# Patient Record
Sex: Female | Born: 1985 | Race: White | Hispanic: No | Marital: Single | Smoking: Current some day smoker
Health system: Southern US, Community
[De-identification: ages and names within clinical notes are randomized; demographics above are authoritative.]

## PROBLEM LIST (undated history)

## (undated) DIAGNOSIS — E213 Hyperparathyroidism, unspecified: Secondary | ICD-10-CM

## (undated) DIAGNOSIS — N289 Disorder of kidney and ureter, unspecified: Secondary | ICD-10-CM

## (undated) HISTORY — PX: URETERAL STENT PLACEMENT: SHX822

## (undated) HISTORY — PX: PARATHYROIDECTOMY: SHX19

## (undated) HISTORY — PX: TUBAL LIGATION: SHX77

---

## 2015-11-23 ENCOUNTER — Emergency Department (HOSPITAL_COMMUNITY): Payer: Self-pay

## 2015-11-23 ENCOUNTER — Emergency Department (HOSPITAL_COMMUNITY)
Admission: EM | Admit: 2015-11-23 | Discharge: 2015-11-23 | Disposition: A | Discharge status: Home / Self Care | Payer: Self-pay | Attending: Emergency Medicine | Admitting: Emergency Medicine

## 2015-11-23 ENCOUNTER — Encounter (HOSPITAL_COMMUNITY): Payer: Self-pay | Admitting: Emergency Medicine

## 2015-11-23 DIAGNOSIS — R5383 Other fatigue: Secondary | ICD-10-CM | POA: Insufficient documentation

## 2015-11-23 DIAGNOSIS — Z880 Allergy status to penicillin: Secondary | ICD-10-CM | POA: Insufficient documentation

## 2015-11-23 DIAGNOSIS — R109 Unspecified abdominal pain: Secondary | ICD-10-CM | POA: Insufficient documentation

## 2015-11-23 DIAGNOSIS — F17200 Nicotine dependence, unspecified, uncomplicated: Secondary | ICD-10-CM | POA: Insufficient documentation

## 2015-11-23 DIAGNOSIS — Z8639 Personal history of other endocrine, nutritional and metabolic disease: Secondary | ICD-10-CM | POA: Insufficient documentation

## 2015-11-23 DIAGNOSIS — Z3202 Encounter for pregnancy test, result negative: Secondary | ICD-10-CM | POA: Insufficient documentation

## 2015-11-23 DIAGNOSIS — Z87442 Personal history of urinary calculi: Secondary | ICD-10-CM | POA: Insufficient documentation

## 2015-11-23 DIAGNOSIS — Z87448 Personal history of other diseases of urinary system: Secondary | ICD-10-CM | POA: Insufficient documentation

## 2015-11-23 DIAGNOSIS — M549 Dorsalgia, unspecified: Secondary | ICD-10-CM | POA: Insufficient documentation

## 2015-11-23 HISTORY — DX: Hyperparathyroidism, unspecified: E21.3

## 2015-11-23 HISTORY — DX: Disorder of kidney and ureter, unspecified: N28.9

## 2015-11-23 LAB — CBC
HCT: 39.7 % (ref 36.0–46.0)
HEMOGLOBIN: 13.3 g/dL (ref 12.0–15.0)
MCH: 31.7 pg (ref 26.0–34.0)
MCHC: 33.5 g/dL (ref 30.0–36.0)
MCV: 94.5 fL (ref 78.0–100.0)
PLATELETS: 312 10*3/uL (ref 150–400)
RBC: 4.2 MIL/uL (ref 3.87–5.11)
RDW: 12.5 % (ref 11.5–15.5)
WBC: 7.7 10*3/uL (ref 4.0–10.5)

## 2015-11-23 LAB — BASIC METABOLIC PANEL
ANION GAP: 7 (ref 5–15)
BUN: 14 mg/dL (ref 6–20)
CALCIUM: 9.7 mg/dL (ref 8.9–10.3)
CHLORIDE: 108 mmol/L (ref 101–111)
CO2: 27 mmol/L (ref 22–32)
CREATININE: 0.99 mg/dL (ref 0.44–1.00)
GFR calc non Af Amer: >60 mL/min (ref >60)
Glucose, Bld: 103 mg/dL — ABNORMAL HIGH (ref 65–99)
Potassium: 3.9 mmol/L (ref 3.5–5.1)
SODIUM: 142 mmol/L (ref 135–145)

## 2015-11-23 LAB — URINALYSIS, ROUTINE W REFLEX MICROSCOPIC
Bilirubin Urine: NEGATIVE
Glucose, UA: NEGATIVE mg/dL
Hgb urine dipstick: NEGATIVE
KETONES UR: NEGATIVE mg/dL
NITRITE: NEGATIVE
PROTEIN: NEGATIVE mg/dL
Specific Gravity, Urine: 1.017 (ref 1.005–1.030)
pH: 7 (ref 5.0–8.0)

## 2015-11-23 LAB — URINE MICROSCOPIC-ADD ON
Bacteria, UA: NONE SEEN
RBC / HPF: NONE SEEN RBC/hpf (ref 0–5)

## 2015-11-23 LAB — POC URINE PREG, ED: PREG TEST UR: NEGATIVE

## 2015-11-23 MED ORDER — KETOROLAC TROMETHAMINE 10 MG PO TABS: 10.0000 mg | ORAL_TABLET | Freq: Four times a day (QID) | ORAL | Status: AC | PRN

## 2015-11-23 MED ORDER — MORPHINE SULFATE (PF) 4 MG/ML IV SOLN
4.0000 mg | Freq: Once | INTRAVENOUS | Status: AC
  Administered 2015-11-23: 4 mg via INTRAVENOUS
  Filled 2015-11-23: qty 1

## 2015-11-23 MED ORDER — ONDANSETRON HCL 4 MG/2ML IJ SOLN
4.0000 mg | Freq: Once | INTRAMUSCULAR | Status: AC
  Administered 2015-11-23: 4 mg via INTRAVENOUS
  Filled 2015-11-23: qty 2

## 2015-11-23 MED ORDER — SODIUM CHLORIDE 0.9 % IV BOLUS (SEPSIS)
1000.0000 mL | Freq: Once | INTRAVENOUS | Status: AC
  Administered 2015-11-23: 1000 mL via INTRAVENOUS

## 2015-11-23 MED ORDER — KETOROLAC TROMETHAMINE 30 MG/ML IJ SOLN
30.0000 mg | Freq: Once | INTRAMUSCULAR | Status: AC
  Administered 2015-11-23: 30 mg via INTRAVENOUS
  Filled 2015-11-23: qty 1

## 2015-11-23 NOTE — ED Notes (Signed)
PA-C at bedside 

## 2015-11-23 NOTE — Discharge Instructions (Signed)
Take your medications as prescribed as needed for pain relief.  Follow up with your Urologoist, Dr. Cleatrice Burkeoughlin, in the next 3-4 days. Please return to the Emergency Department if symptoms worsen or new onset of fever, chills, abdominal pain, vomiting, diarrhea, blood in urine or stool.

## 2015-11-23 NOTE — ED Notes (Signed)
Per pt, states lower back pain for three days-states she thinks she has a kidney infection-no dysuria

## 2015-11-23 NOTE — ED Notes (Signed)
US at bedside

## 2015-11-23 NOTE — ED Notes (Signed)
Nurse drawing labs. 

## 2015-11-23 NOTE — ED Provider Notes (Signed)
CSN: [id number]     Arrival date & time 11/23/15  1451 History   First MD Initiated Contact with Patient 11/23/15 1508     Chief Complaint  Patient presents with  . Back Pain     (Consider location/radiation/quality/duration/timing/severity/associated sxs/prior Treatment) Patient is a 29 y.o. female presenting with back pain.  Back Pain   Pt is a 28 yo female with PMH of hyperparathyroidism and renal disorder (multiple episodes of kidney stones, hx of 12 stents and 4 lithotripsies) who presents to the ED with complaint of bilateral flank pain, onset 3 days. Pt reports having worsening intermittent pain to bilateral flank regions that she describes as "pressure" but notes she is starting to have worsening aching pain to her right flank. Denies any aggravating or alleviating factors. Endorses associated fatigue. Denies fever, chills, cough, SOB, CP, abdominal pain, N/V/D, urinary sxs, hematuria, vaginal bleeding, vaginal d/c. Pt reports her pain feels similar to kidney stones/infections she has had in the past. She notes she last had a 6mm left kidney stone 3 months ago that she passed. LMP 2 weeks ago.   Past Medical History  Diagnosis Date  . Hyperparathyroidism (HCC)   . Renal disorder    Past Surgical History  Procedure Laterality Date  . Parathyroidectomy    . Tubal ligation    . Ureteral stent placement    . Cesarean section     No family history on file. Social History  Substance Use Topics  . Smoking status: Current Some Day Smoker  . Smokeless tobacco: None  . Alcohol Use: Yes   OB History    No data available     Review of Systems  Constitutional: Positive for fatigue.  Genitourinary: Positive for flank pain.  Musculoskeletal: Positive for back pain.  All other systems reviewed and are negative.     Allergies  Penicillins  Home Medications   Prior to Admission medications   Medication Sig Start Date End Date Taking? Authorizing Provider  ketorolac  (TORADOL) 10 MG tablet Take 1 tablet (10 mg total) by mouth every 6 (six) hours as needed. 11/23/15   Satira Sark Lindbergh Winkles, PA-C   BP 98/67 mmHg  Pulse 63  Temp(Src) 97.7 F (36.5 C) (Oral)  Resp 16  SpO2 100%  LMP 11/09/2015 Physical Exam  Constitutional: She is oriented to person, place, and time. She appears well-developed and well-nourished.  HENT:  Head: Normocephalic and atraumatic.  Mouth/Throat: Oropharynx is clear and moist. No oropharyngeal exudate.  Eyes: Conjunctivae and EOM are normal. Right eye exhibits no discharge. Left eye exhibits no discharge. No scleral icterus.  Neck: Normal range of motion. Neck supple.  Cardiovascular: Normal rate, regular rhythm, normal heart sounds and intact distal pulses.   Pulmonary/Chest: Effort normal and breath sounds normal. No respiratory distress. She has no wheezes. She has no rales. She exhibits no tenderness.  Abdominal: Soft. Bowel sounds are normal. She exhibits no distension and no mass. There is no tenderness. There is CVA tenderness (left). There is no rigidity, no rebound and no guarding.  Musculoskeletal: She exhibits no edema.  Neurological: She is alert and oriented to person, place, and time.  Skin: Skin is warm and dry.  Nursing note and vitals reviewed.   ED Course  Procedures (including critical care time) Labs Review Labs Reviewed  BASIC METABOLIC PANEL - Abnormal; Notable for the following:    Glucose, Bld 103 (*)    All other components within normal limits  URINALYSIS, ROUTINE W REFLEX MICROSCOPIC (  NOT AT [facility]) - Abnormal; Notable for the following:    Leukocytes, UA TRACE (*)    All other components within normal limits  URINE MICROSCOPIC-ADD ON - Abnormal; Notable for the following:    Squamous Epithelial / LPF 0-5 (*)    All other components within normal limits  CBC  POC URINE PREG, ED    Imaging Review [facility]  11/23/2015  CLINICAL DATA:  Bilateral flank pain, history of nephrolithiasis and  pyelonephritis EXAM: RENAL / URINARY TRACT ULTRASOUND COMPLETE COMPARISON:  None. FINDINGS: Right Kidney: Length: 10.8 cm. Echogenicity within normal limits. No mass or hydronephrosis visualized. Left Kidney: Length: 12.9 cm. Echogenicity within normal limits. No mass or hydronephrosis visualized. Bladder: Appears normal for degree of bladder distention. IMPRESSION: Normal renal ultrasound for age. Electronically Signed   By: Judie PetitM.  Shick M.D.   On: 11/23/2015 18:19   I have personally reviewed and evaluated these images and lab results as part of my medical decision-making.  Filed Vitals:   11/23/15 1457 11/23/15 1713  BP: 121/80 98/67  Pulse: 76 63  Temp: 97.7 F (36.5 C)   Resp: 16 16     MDM   Final diagnoses:  Flank pain    Pt presents with worsening bilateral flank pain. Hx of multiple kidney stones/infections, ureteral stent placements and lithotripsies. Urologist Dr. Cleatrice Burkeoughlin [facility] She notes her last stone was 3 months ago which she passed at home. VSS. Exam revealed left CVA tenderness, abdominal exam benign. Labs and UA unremarkable. Due to pt's hx of multiple kidney stones and pyelo, will order US for further evaluation. Renal US normal, no mass or hydronephrosis seen. Discussed results with pt, she reports that she is planning to call her urologist tomorrow to schedule a follow up appointment. I do not feel that any further workup or imaging is warranted at this time. Plan to d/c pt home with NSAIDs for pain relief.   Evaluation does not show pathology requring ongoing emergent intervention or admission. Pt is hemodynamically stable and mentating appropriately. Discussed plan with patient/guardian, who agrees with plan. All questions answered. Return precautions discussed and outpatient follow up given.      Satira Sarkicole Elizabeth [place], [place]-C 11/23/15 1905  Loren Raceravid Yelverton, MD 12/03/15 [phone] [id number]

## 2016-02-06 IMAGING — US US RENAL
1 series · 14 of 25 positions shown · non-contrast
Comparison: None.

CLINICAL DATA: Bilateral flank pain, history of nephrolithiasis and
pyelonephritis

EXAM:
RENAL / URINARY TRACT ULTRASOUND COMPLETE

[Series 1: us renal · 0.19mm/px · 14 of 30 slices shown]
[im 1/30]
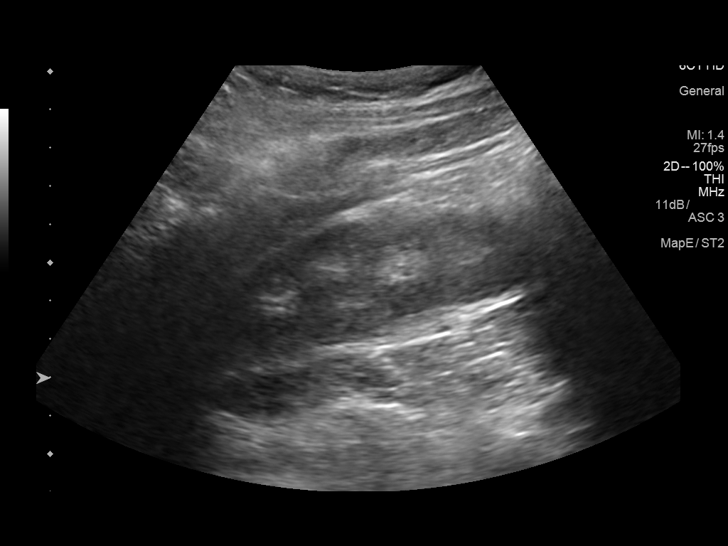
[im 3/30]
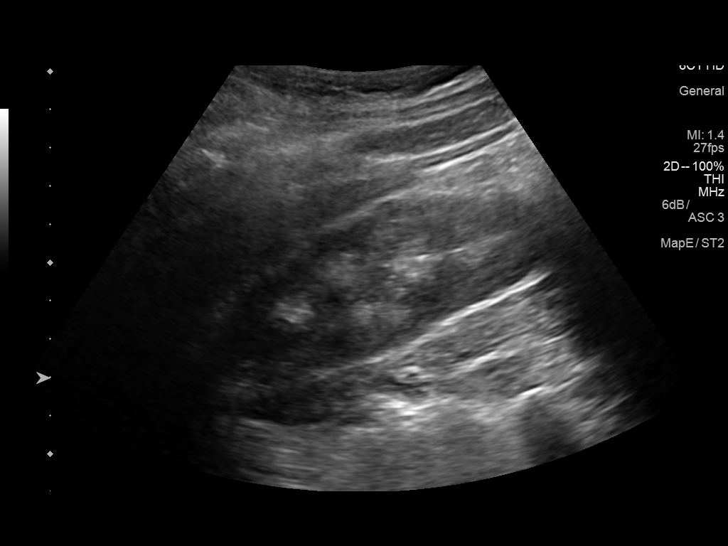
[im 5/30]
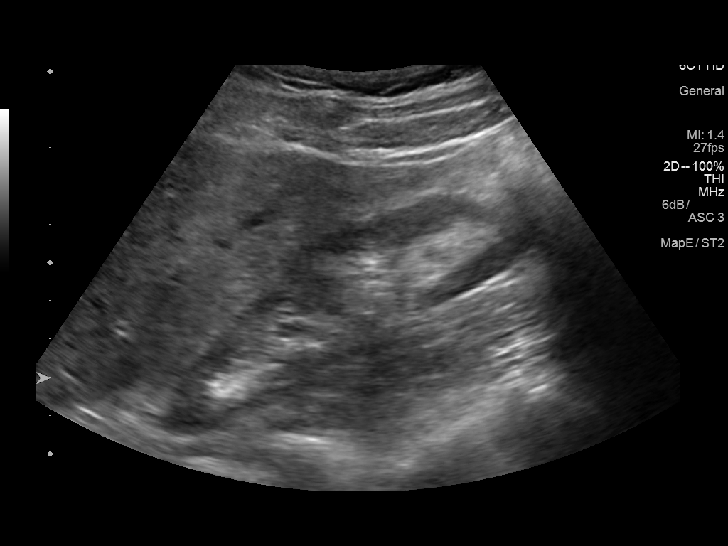
[im 8/30]
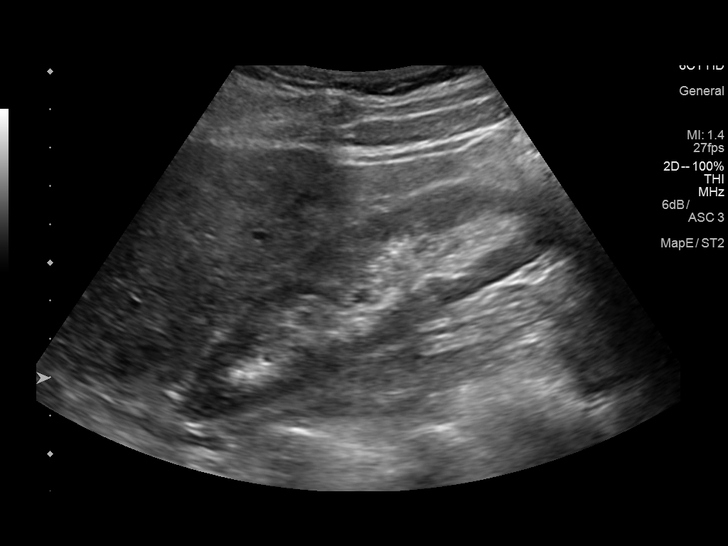
[im 10/30]
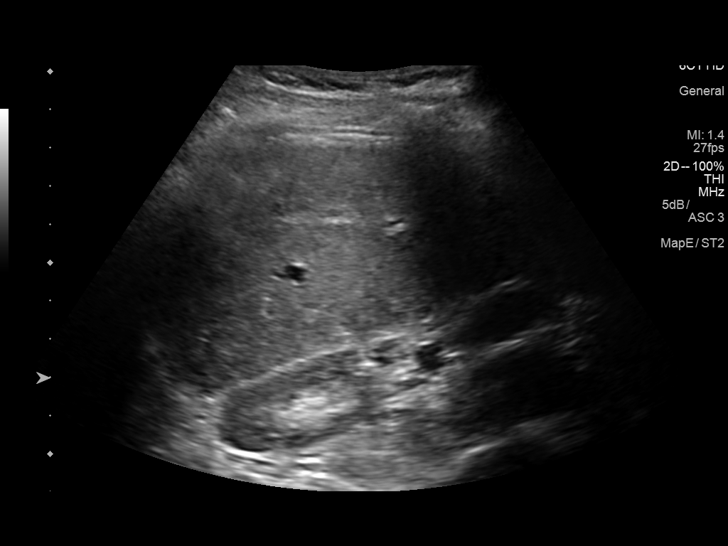
[im 11/30]
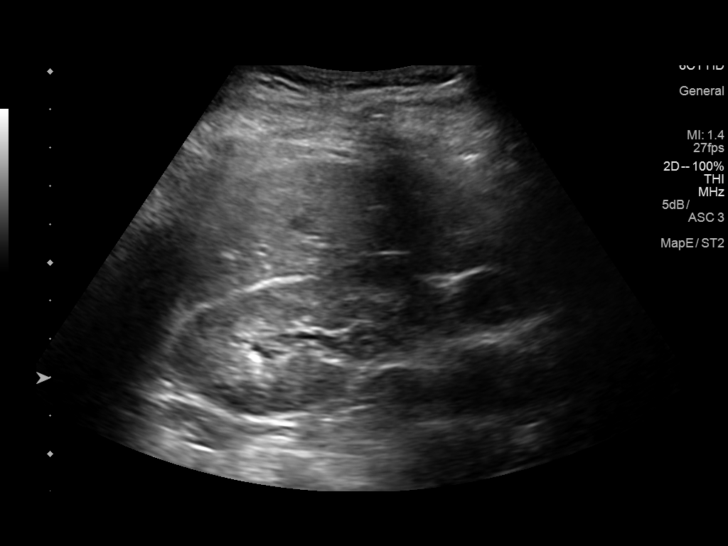
[im 14/30]
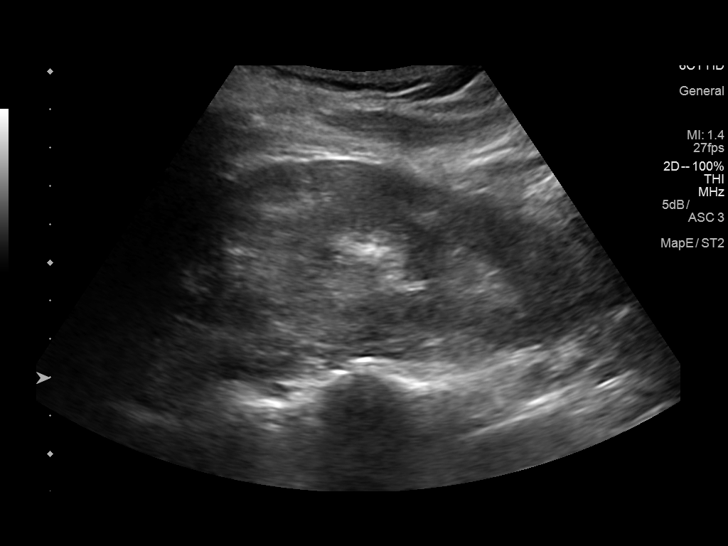
[im 16/30]
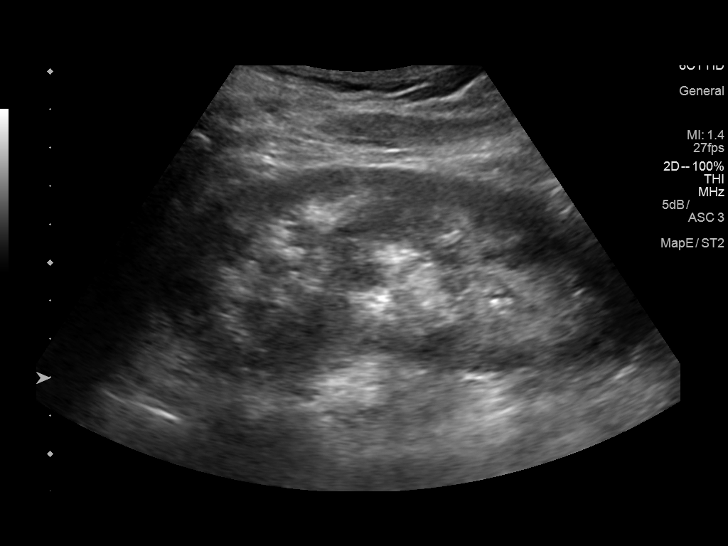
[im 19/30]
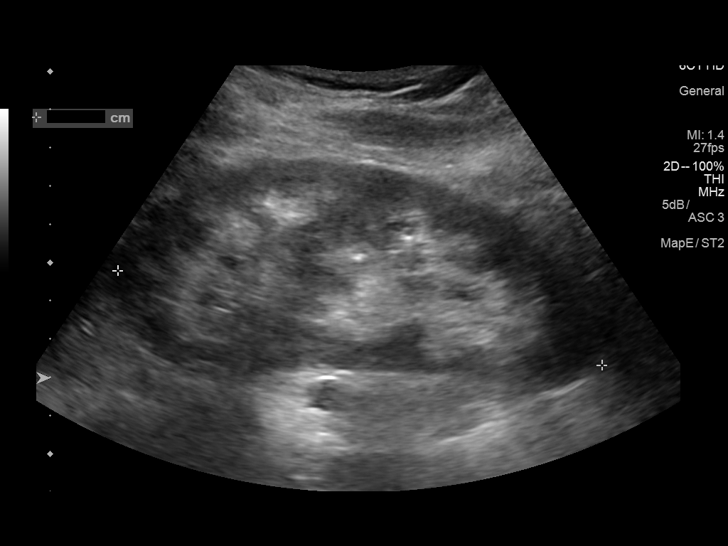
[im 20/30]
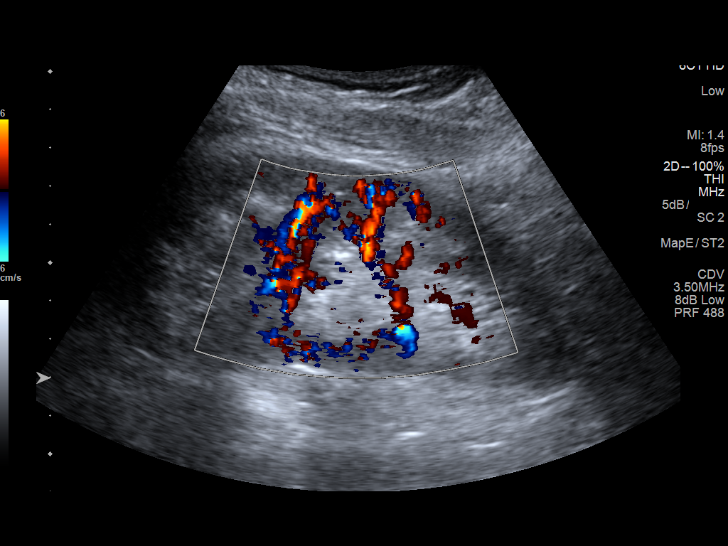
[im 22/30]
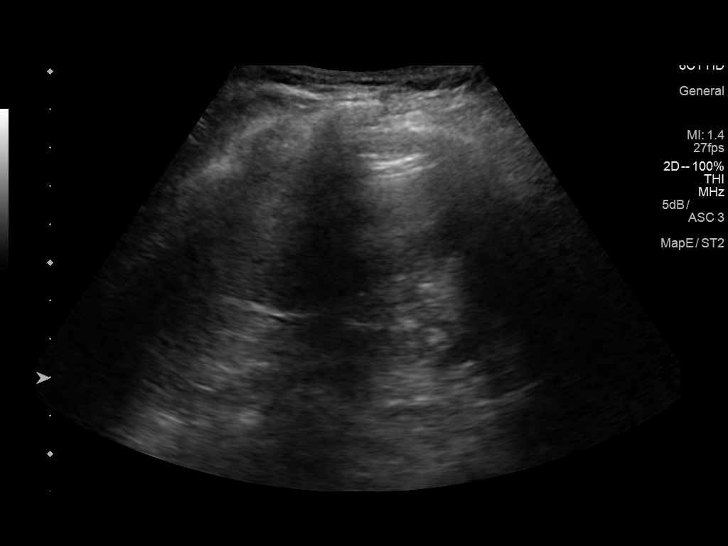
[im 25/30]
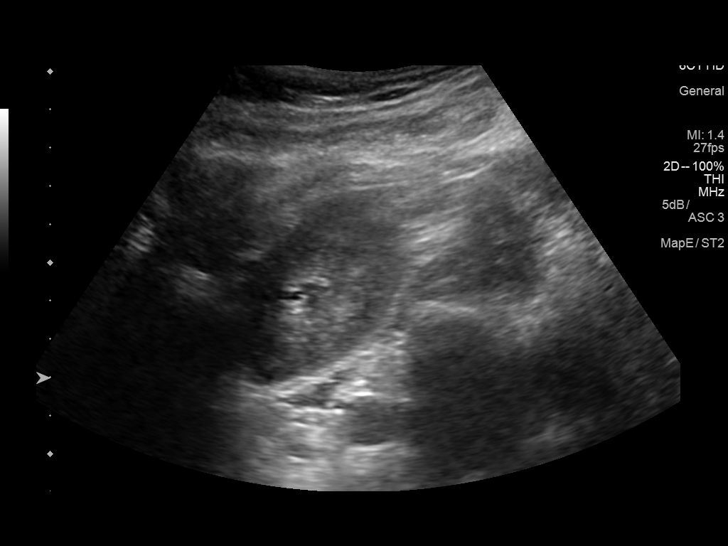
[im 27/30]
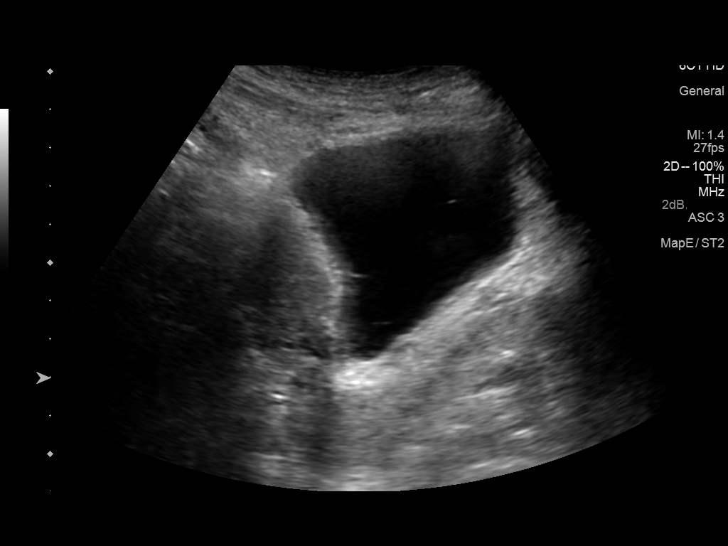
[im 30/30]
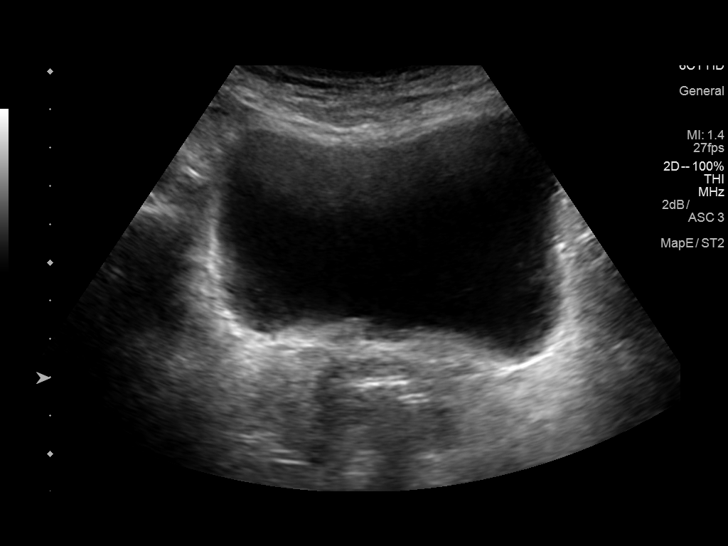

[14 of 25 positions shown; findings below may reference images not displayed]

FINDINGS: Right Kidney:

Length: 10.8 cm. Echogenicity within normal limits. No mass or
hydronephrosis visualized.

Left Kidney:

Length: 12.9 cm. Echogenicity within normal limits. No mass or
hydronephrosis visualized.

Bladder:

Appears normal for degree of bladder distention.
IMPRESSION: Normal renal ultrasound for age.
